# Patient Record
Sex: Female | Born: 1955 | Race: White | Hispanic: No | Marital: Married | Smoking: Never smoker
Health system: Southern US, Community
[De-identification: ages and names within clinical notes are randomized; demographics above are authoritative.]

## PROBLEM LIST (undated history)

## (undated) DIAGNOSIS — C539 Malignant neoplasm of cervix uteri, unspecified: Secondary | ICD-10-CM

## (undated) HISTORY — PX: NO PAST SURGERIES: SHX2092

---

## 2016-04-23 ENCOUNTER — Encounter (HOSPITAL_COMMUNITY): Payer: Self-pay | Admitting: Family Medicine

## 2016-04-23 ENCOUNTER — Observation Stay (HOSPITAL_COMMUNITY)
Admission: AD | Admit: 2016-04-23 | Discharge: 2016-04-24 | Disposition: A | Discharge status: Home / Self Care | Payer: BLUE CROSS/BLUE SHIELD | Source: Other Acute Inpatient Hospital | Attending: Internal Medicine | Admitting: Internal Medicine

## 2016-04-23 ENCOUNTER — Inpatient Hospital Stay (HOSPITAL_COMMUNITY): Payer: BLUE CROSS/BLUE SHIELD

## 2016-04-23 DIAGNOSIS — D696 Thrombocytopenia, unspecified: Secondary | ICD-10-CM

## 2016-04-23 DIAGNOSIS — I2699 Other pulmonary embolism without acute cor pulmonale: Secondary | ICD-10-CM | POA: Diagnosis not present

## 2016-04-23 DIAGNOSIS — R531 Weakness: Secondary | ICD-10-CM | POA: Diagnosis not present

## 2016-04-23 DIAGNOSIS — Z79899 Other long term (current) drug therapy: Secondary | ICD-10-CM | POA: Insufficient documentation

## 2016-04-23 DIAGNOSIS — E876 Hypokalemia: Secondary | ICD-10-CM | POA: Insufficient documentation

## 2016-04-23 DIAGNOSIS — D72819 Decreased white blood cell count, unspecified: Secondary | ICD-10-CM | POA: Diagnosis not present

## 2016-04-23 DIAGNOSIS — D61818 Other pancytopenia: Secondary | ICD-10-CM | POA: Diagnosis not present

## 2016-04-23 DIAGNOSIS — T451X5A Adverse effect of antineoplastic and immunosuppressive drugs, initial encounter: Secondary | ICD-10-CM | POA: Diagnosis not present

## 2016-04-23 DIAGNOSIS — D649 Anemia, unspecified: Secondary | ICD-10-CM

## 2016-04-23 DIAGNOSIS — C539 Malignant neoplasm of cervix uteri, unspecified: Secondary | ICD-10-CM | POA: Diagnosis not present

## 2016-04-23 DIAGNOSIS — M6281 Muscle weakness (generalized): Secondary | ICD-10-CM

## 2016-04-23 DIAGNOSIS — R112 Nausea with vomiting, unspecified: Secondary | ICD-10-CM | POA: Diagnosis not present

## 2016-04-23 DIAGNOSIS — J8410 Pulmonary fibrosis, unspecified: Secondary | ICD-10-CM | POA: Insufficient documentation

## 2016-04-23 DIAGNOSIS — J189 Pneumonia, unspecified organism: Secondary | ICD-10-CM

## 2016-04-23 DIAGNOSIS — R1110 Vomiting, unspecified: Secondary | ICD-10-CM

## 2016-04-23 HISTORY — DX: Malignant neoplasm of cervix uteri, unspecified: C53.9

## 2016-04-23 LAB — COMPREHENSIVE METABOLIC PANEL
ALT: 22 U/L (ref 14–54)
ANION GAP: 9 (ref 5–15)
AST: 21 U/L (ref 15–41)
Albumin: 2.5 g/dL — ABNORMAL LOW (ref 3.5–5.0)
Alkaline Phosphatase: 115 U/L (ref 38–126)
BILIRUBIN TOTAL: 1.3 mg/dL — AB (ref 0.3–1.2)
BUN: 23 mg/dL — ABNORMAL HIGH (ref 6–20)
CHLORIDE: 112 mmol/L — AB (ref 101–111)
CO2: 18 mmol/L — ABNORMAL LOW (ref 22–32)
Calcium: 8.3 mg/dL — ABNORMAL LOW (ref 8.9–10.3)
Creatinine, Ser: 0.96 mg/dL (ref 0.44–1.00)
Glucose, Bld: 96 mg/dL (ref 65–99)
POTASSIUM: 3.3 mmol/L — AB (ref 3.5–5.1)
Sodium: 139 mmol/L (ref 135–145)
TOTAL PROTEIN: 6.2 g/dL — AB (ref 6.5–8.1)

## 2016-04-23 LAB — CBC WITH DIFFERENTIAL/PLATELET
BASOS ABS: 0 10*3/uL (ref 0.0–0.1)
BASOS PCT: 0 %
EOS ABS: 0.1 10*3/uL (ref 0.0–0.7)
Eosinophils Relative: 2 %
HCT: 26.6 % — ABNORMAL LOW (ref 36.0–46.0)
Hemoglobin: 8.6 g/dL — ABNORMAL LOW (ref 12.0–15.0)
LYMPHS PCT: 43 %
Lymphs Abs: 1.2 10*3/uL (ref 0.7–4.0)
MCH: 26.8 pg (ref 26.0–34.0)
MCHC: 32.3 g/dL (ref 30.0–36.0)
MCV: 82.9 fL (ref 78.0–100.0)
MONO ABS: 0.1 10*3/uL (ref 0.1–1.0)
Monocytes Relative: 2 %
NEUTROS PCT: 53 %
Neutro Abs: 1.5 10*3/uL — ABNORMAL LOW (ref 1.7–7.7)
PLATELETS: 7 10*3/uL — AB (ref 150–400)
RBC: 3.21 MIL/uL — ABNORMAL LOW (ref 3.87–5.11)
RDW: 14.1 % (ref 11.5–15.5)
WBC: 2.9 10*3/uL — AB (ref 4.0–10.5)

## 2016-04-23 LAB — RETICULOCYTES

## 2016-04-23 LAB — APTT: APTT: 28 s (ref 24–36)

## 2016-04-23 LAB — MAGNESIUM: MAGNESIUM: 1.4 mg/dL — AB (ref 1.7–2.4)

## 2016-04-23 LAB — PREPARE RBC (CROSSMATCH)

## 2016-04-23 LAB — MRSA PCR SCREENING: MRSA by PCR: NEGATIVE

## 2016-04-23 LAB — PHOSPHORUS: PHOSPHORUS: 1.6 mg/dL — AB (ref 2.5–4.6)

## 2016-04-23 LAB — ABO/RH: ABO/RH(D): A POS

## 2016-04-23 LAB — SAVE SMEAR

## 2016-04-23 LAB — PROTIME-INR
INR: 1.21
Prothrombin Time: 15.4 seconds — ABNORMAL HIGH (ref 11.4–15.2)

## 2016-04-23 IMAGING — CR DG CHEST 2V
2 series · 2 of 2 positions shown · non-contrast
Comparison: None.

CLINICAL DATA: Pneumonia

EXAM:
CHEST  2 VIEW

[chest lat]
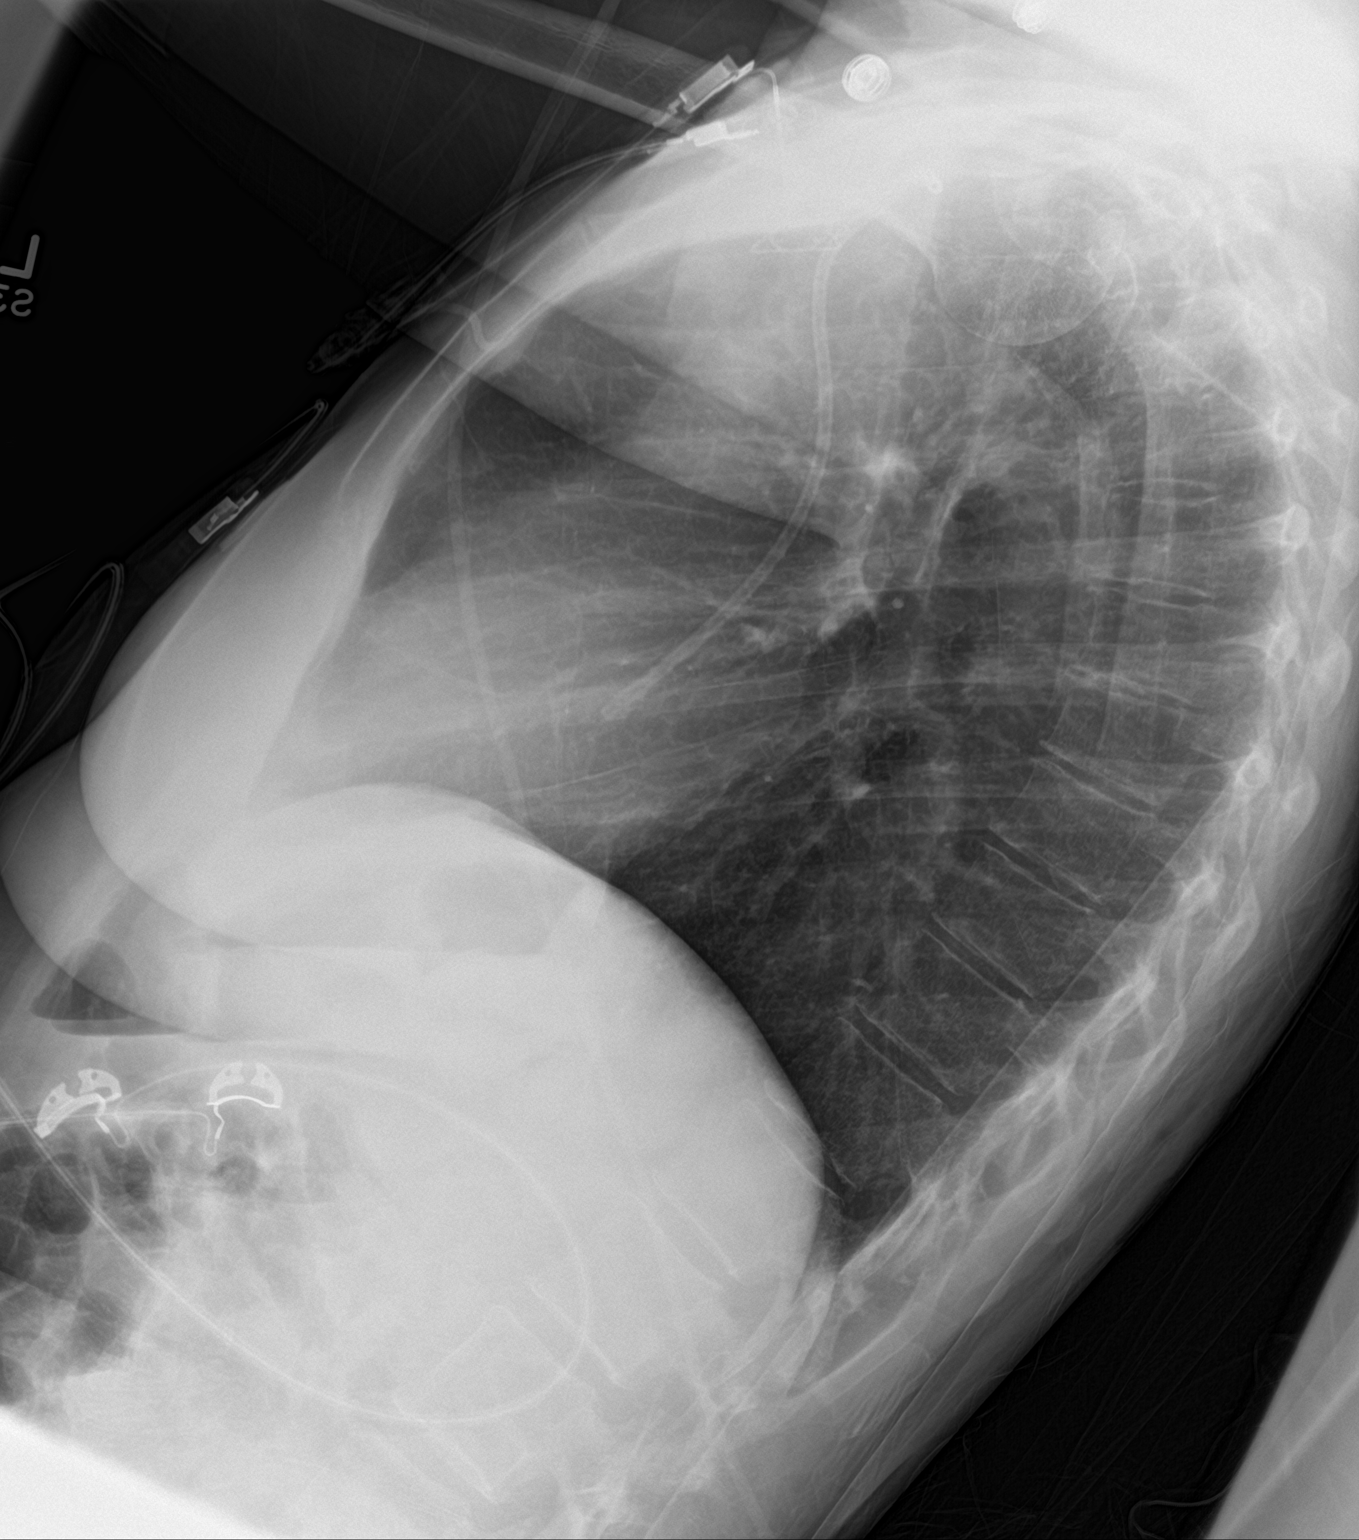

[chest ap]
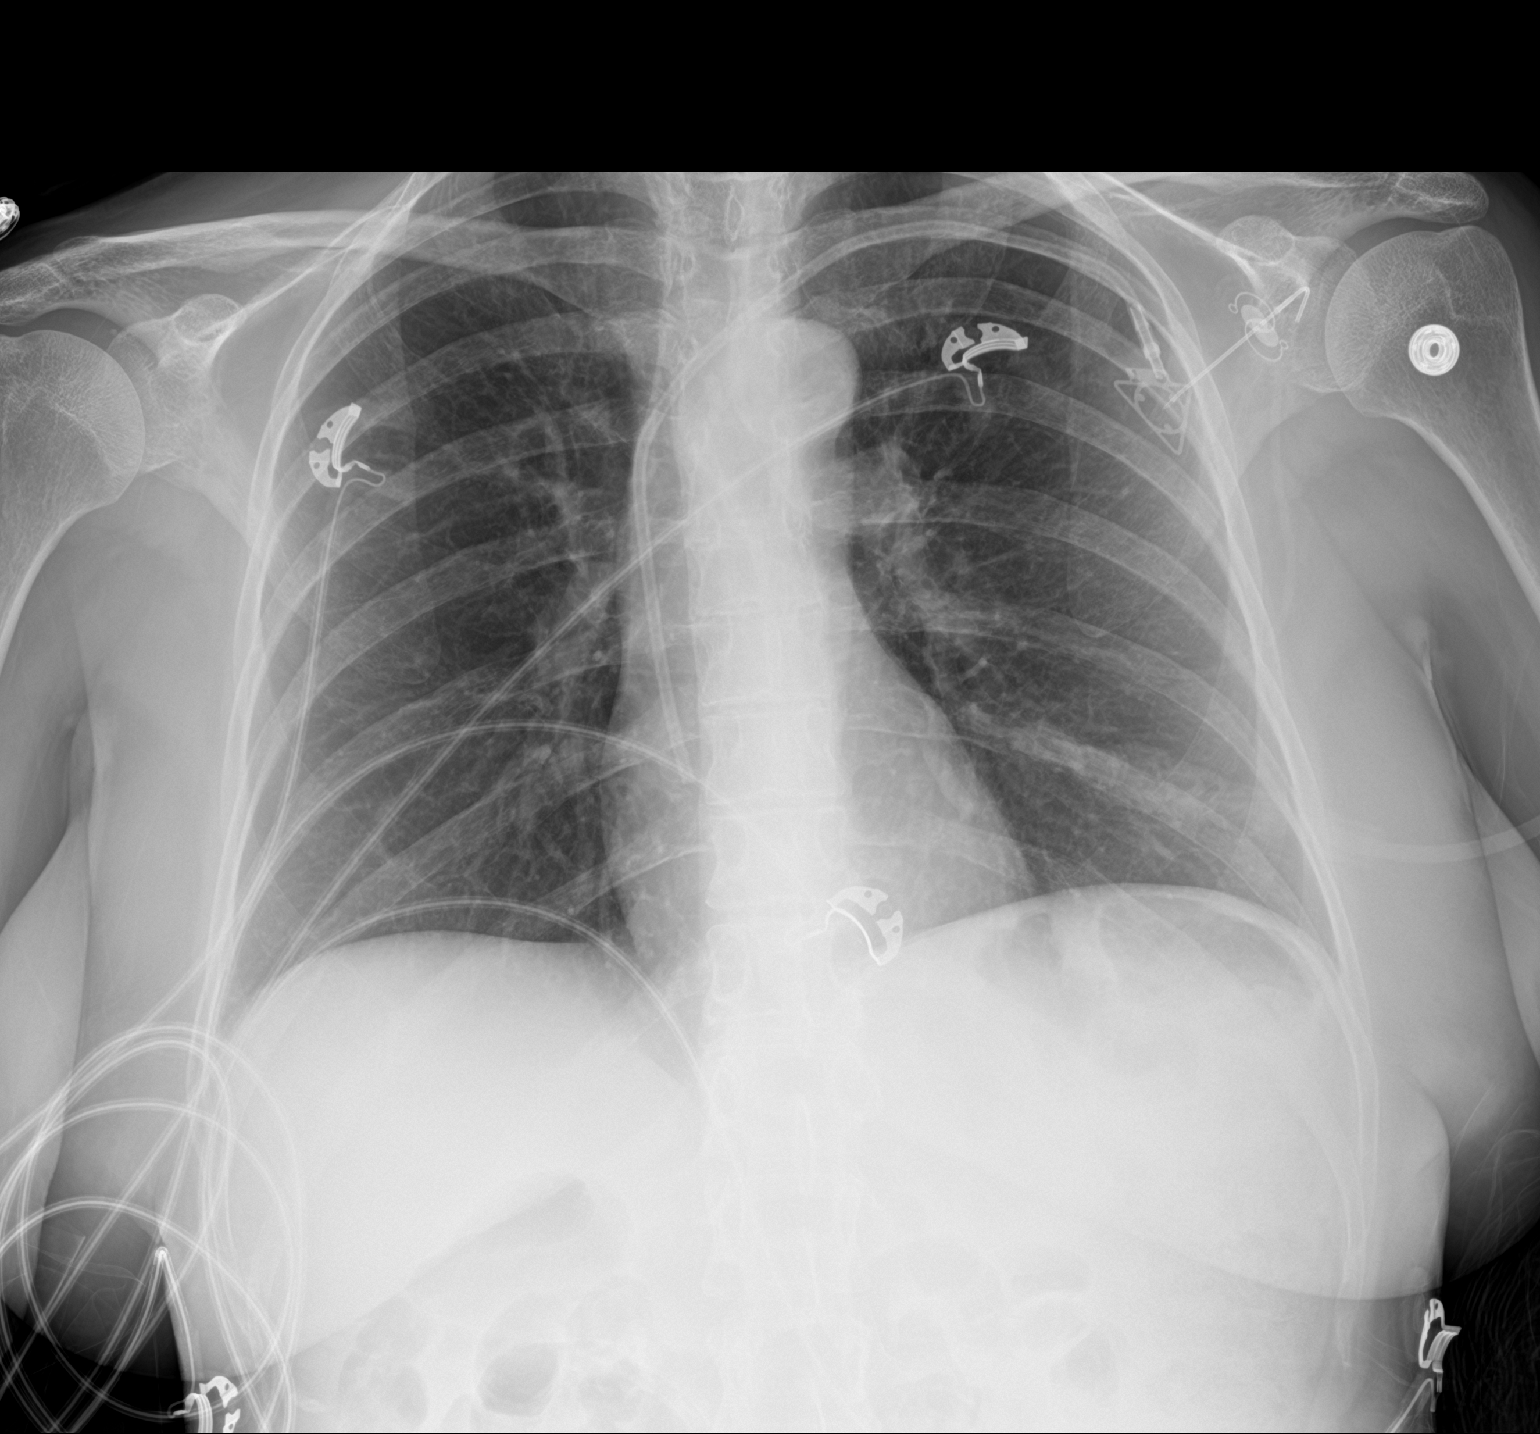

[2 of 2 positions shown; findings below may reference images not displayed]

FINDINGS: Porta catheter on the left with tip at the upper right atrium.
Normal heart size and mediastinal contours. No acute infiltrate or
edema. No effusion or pneumothorax. No acute osseous findings.
IMPRESSION: No evidence of active disease.

## 2016-04-23 MED ORDER — ACETAMINOPHEN 650 MG RE SUPP: 650.0000 mg | Freq: Four times a day (QID) | RECTAL | Status: DC | PRN

## 2016-04-23 MED ORDER — SODIUM CHLORIDE 0.9% FLUSH: 10.0000 mL | INTRAVENOUS | Status: DC | PRN

## 2016-04-23 MED ORDER — ONDANSETRON HCL 4 MG PO TABS: 4.0000 mg | ORAL_TABLET | Freq: Four times a day (QID) | ORAL | Status: DC | PRN

## 2016-04-23 MED ORDER — ACETAMINOPHEN 325 MG PO TABS: 650.0000 mg | ORAL_TABLET | Freq: Four times a day (QID) | ORAL | Status: DC | PRN

## 2016-04-23 MED ORDER — SODIUM CHLORIDE 0.9 % IV SOLN
INTRAVENOUS | Status: DC
  Administered 2016-04-23 – 2016-04-24 (×2): via INTRAVENOUS

## 2016-04-23 MED ORDER — ONDANSETRON HCL 4 MG/2ML IJ SOLN
4.0000 mg | Freq: Four times a day (QID) | INTRAMUSCULAR | Status: DC | PRN
  Administered 2016-04-23: 4 mg via INTRAVENOUS
  Filled 2016-04-23: qty 2

## 2016-04-23 MED ORDER — SODIUM CHLORIDE 0.9% FLUSH
3.0000 mL | Freq: Two times a day (BID) | INTRAVENOUS | Status: DC
  Administered 2016-04-23 – 2016-04-24 (×2): 3 mL via INTRAVENOUS

## 2016-04-23 MED ORDER — IOPAMIDOL (ISOVUE-370) INJECTION 76%
INTRAVENOUS | Status: AC
  Administered 2016-04-23: 80 mL
  Filled 2016-04-23: qty 100

## 2016-04-23 MED ORDER — SODIUM CHLORIDE 0.9 % IV SOLN: Freq: Once | INTRAVENOUS | Status: DC

## 2016-04-23 MED ORDER — PROMETHAZINE HCL 25 MG/ML IJ SOLN: 12.5000 mg | Freq: Four times a day (QID) | INTRAMUSCULAR | Status: DC | PRN

## 2016-04-23 MED ORDER — OXYCODONE HCL 5 MG PO TABS: 5.0000 mg | ORAL_TABLET | ORAL | Status: DC | PRN

## 2016-04-23 NOTE — Progress Notes (Signed)
Patient arrived via EMS from [facility] to 3s16. VSS with no complaints of pain. Patient on monitor and call bell within reach with safety discussed. Paged admission MD of patients arrival. Will continue to monitor.

## 2016-04-23 NOTE — Care Management Note (Signed)
Case Management Note  Patient Details  Name: Julie Harrell MRN: [id number] Date of Birth: 12-28-1955  Subjective/Objective:  Stage 4 cervical cancer, getting chemotherapy, plts is 13.  Patient lives with spouse, pta indep, she has pcp, has medication coverage and she has transportation at discharge, she goes to outpatient to receive chemotherapy in [place].  NCM will cont to follow for dc needs.                  Action/Plan:   Expected Discharge Date:                  Expected Discharge Plan:  Home/Self Care  In-House Referral:     Discharge planning Services  CM Consult  Post Acute Care Choice:    Choice offered to:     DME Arranged:    DME Agency:     HH Arranged:    HH Agency:     Status of Service:  In process, will continue to follow  If discussed at Long Length of Stay Meetings, dates discussed:    Additional Comments:  [name], RN 04/23/2016, 4:39 PM

## 2016-04-23 NOTE — Progress Notes (Signed)
Called by radiologist after CTA which notes very small 3 mm pulmonary embolus. Difficult to say whether this is new for him original PE back in July. Anticoagulation will need to be discussed further with patient after thrombus cytopenia improves. Will obtain lower extremity venous duplex to look for ongoing lower extremity DVT. Of note early in the day CBC returned with plate count less than 7. Platelet transfusion ordered and follow-up labs pending.  Linna Darner, MD [facility] 04/23/2016, 5:27 PM

## 2016-04-23 NOTE — Progress Notes (Signed)
Additional information obtained by nursing staff concerning patient's past medical history and current medications. Patient states that approximately 6 weeks ago she was diagnosed with a massive saddle pulmonary embolism and was started on Plavix by her primary care physician. Patient states that she was never offered a typical agent such as Xarelto, Coumadin, I'll request. Patient states that she's never had lower extremity swelling. Patient states she had a follow-up scan which showed that the embolisms breaking up. Per review of patient's chart through care everywhere her PE was documented in office notes as early as 02/18/2016 but there is no imaging or hospital encounter to confirm a PE. At this time patient's renal function is normal and we will proceed with CTA chest to evaluate for PE. Of note Plavix is not an option for treatment of PE unless all other agents are contraindicated. There is no discussion of contraindication of anticoagulation from her oncology notes as read in care everywhere. At this point in time patient's platelets are at 13 so no anticoagulation is needed. Further decisions regarding anticoagulation will depend on how her platelets rebound and on diagnostic CTA. I'll consider lower extremity venous duplex if CTA is negative.  Linna Darner, MD [facility] 04/23/2016, 1:34 PM

## 2016-04-23 NOTE — H&P (Signed)
History and Physical    Rue Krabbenhoft [id number] DOB: Mar 03, 1956 DOA: 04/23/2016  PCP: Jacqualine Code, DO Patient coming from: [facility]  Chief Complaint: n/v weakness  HPI: Julie Harrell is a 60 y.o. female with medical history significant of recurrent cervical cancer. Patient receives treatment for this at [facility]. Patient with left chest wall Port-A-Cath in place. Patient started her chemotherapy 2 weeks ago. Patient had undergone her second chemotherapy on 04/17/2016. Since initiating her chemotherapy she has had fairly constant nausea and vomiting. This is significantly relieved, though intermittently, with Phenergan and Zofran together. Patient became progressively more weak over the course of the last 6 days to the point where she is unable to function. Patient went to [facility] for evaluation. [facility] and [facility] both were without any beds to evaluate and treat the patient so transfer was requested to bring her to [facility]. All transfer records were reviewed. Objective findings outlined below. Patient was given Zofran, 1 L normal saline bolus, and cefepime prior to transfer. Patient denies any chest pain, shortness of breath, palpitations, cough, neck stiffness, headache, abdominal pain, dysuria, frequency, back pain, rash.   Review of Systems: As per HPI otherwise 10 point review of systems negative.   Ambulatory Status:no restrictions  Past Medical History:  Diagnosis Date  . Recurrent cervical cancer of unknown cell type [facility])     Past Surgical History:  Procedure Laterality Date  . NO PAST SURGERIES      Social History   Social History  . Marital status: Married    Spouse name: N/A  . Number of children: N/A  . Years of education: N/A   Occupational History  . Not on file.   Social History Main Topics  . Smoking status: Never Smoker  . Smokeless tobacco: Not on file  . Alcohol use No  .  Drug use: No  . Sexual activity: Not on file   Other Topics Concern  . Not on file   Social History Narrative  . No narrative on file    Allergies not on file  Family History  Problem Relation Age of Onset  . Cancer Neg Hx   . Diabetes Neg Hx   . Hypertension Neg Hx   . Hyperlipidemia Neg Hx     Prior to Admission medications   Not on File    Physical Exam: Vitals:   04/23/16 0800  BP: 113/75  Pulse: 98  Resp: 19  Temp: 98.5 F (36.9 C)  TempSrc: Oral  SpO2: 99%  Weight: 55.1 kg (121 lb 7.6 oz)  Height: 5\' 5"  (1.651 m)     General:  Appears calm and comfortable Eyes:  PERRL, EOMI, normal lids, iris ENT:  grossly normal hearing, lips & tongue, mmm Neck:  no LAD, masses or thyromegaly Cardiovascular:  RRR, no m/r/g. No LE edema.  Respiratory:  CTA bilaterally, no w/r/r. Normal respiratory effort. Abdomen:  soft, ntnd, NABS Skin:  no rash or induration seen on limited exam Musculoskeletal:  grossly normal tone BUE/BLE, good ROM, no bony abnormality. L upper chest wall w/ portocath in place.  Psychiatric:  grossly normal mood and affect, speech fluent and appropriate, AOx3 Neurologic:  CN 2-12 grossly intact, moves all extremities in coordinated fashion, sensation intact  Labs on Admission: I have personally reviewed following labs and imaging studies  CBC: No results for input(s): WBC, NEUTROABS, HGB, HCT, MCV, PLT in the last 168 hours. Basic Metabolic Panel: No results for input(s): NA, K,  CL, CO2, GLUCOSE, BUN, CREATININE, CALCIUM, MG, PHOS in the last 168 hours. GFR: CrCl cannot be calculated (No order found.). Liver Function Tests: No results for input(s): AST, ALT, ALKPHOS, BILITOT, PROT, ALBUMIN in the last 168 hours. No results for input(s): LIPASE, AMYLASE in the last 168 hours. No results for input(s): AMMONIA in the last 168 hours. Coagulation Profile: No results for input(s): INR, PROTIME in the last 168 hours. Cardiac Enzymes: No results for  input(s): CKTOTAL, CKMB, CKMBINDEX, TROPONINI in the last 168 hours. BNP (last 3 results) No results for input(s): PROBNP in the last 8760 hours. HbA1C: No results for input(s): HGBA1C in the last 72 hours. CBG: No results for input(s): GLUCAP in the last 168 hours. Lipid Profile: No results for input(s): CHOL, HDL, LDLCALC, TRIG, CHOLHDL, LDLDIRECT in the last 72 hours. Thyroid Function Tests: No results for input(s): TSH, T4TOTAL, FREET4, T3FREE, THYROIDAB in the last 72 hours. Anemia Panel: No results for input(s): VITAMINB12, FOLATE, FERRITIN, TIBC, IRON, RETICCTPCT in the last 72 hours. Urine analysis: No results found for: COLORURINE, APPEARANCEUR, LABSPEC, PHURINE, GLUCOSEU, HGBUR, BILIRUBINUR, KETONESUR, PROTEINUR, UROBILINOGEN, NITRITE, LEUKOCYTESUR  Creatinine Clearance: CrCl cannot be calculated (No order found.).  Sepsis Labs: @LABRCNTIP (procalcitonin:4,lacticidven:4) ) Recent Results (from the past 240 hour(s))  MRSA PCR Screening     Status: None   Collection Time: 04/23/16  8:07 AM  Result Value Ref Range Status   MRSA by PCR NEGATIVE NEGATIVE Final    Comment:        The GeneXpert MRSA Assay (FDA approved for NASAL specimens only), is one component of a comprehensive MRSA colonization surveillance program. It is not intended to diagnose MRSA infection nor to guide or monitor treatment for MRSA infections.      Radiological Exams on Admission: No results found.    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 Review of records from March the hospital show WBC 2.3, hemoglobin 7.6, hematocrit 23, platelets 13. No previous studies to compare. She was in her second week of chemotherapy. No h/o GI bleed or vaginal bleeding.  - 1 unit PRBC - PLT transfer if less than 10,000 or starts to  bleed - CBC now then Q12 - FOBT -   N/V Weakness: Secondary to side effects from chemotherapy. Controlled at this time. Patient states that Phenergan and Zofran to work very well. - EKG - Phenergan/Zofran - IVF - PT  Recurrent Cervical Cancer: Currently undergoing chemotherapy treatment at [facility]. Patient has received 2 rounds of chemotherapy with her last treatment on 04/17/2016. Patient due for next chemotherapy on 04/24/2016  - A defer further chemotherapy given side effects of previous administration - Follow-up outpatient oncology and [facility]  Infectious: No signs of neutropenic infection/fever. UA performed in [place] was unremarkable. Unable to see plain film properly on the disc taken down from [place]. Patient was started on cefepime prior to transfer. WBC 2.3, lactic acid 1.6, AF VSS. - Hold further antibiotics - BCX, UCX - Repeat chest x-ray    DVT prophylaxis: SCD  Code Status: full  Family Communication: none  Disposition Plan: pendingi mprovement  Consults called: none  Admission status: inpt    MERRELL, DAVID J MD [facility]  If 7PM-7AM, please contact night-coverage [email or web address] Password [email or web address]  04/23/2016, 11:17 AM

## 2016-04-24 ENCOUNTER — Inpatient Hospital Stay (HOSPITAL_COMMUNITY): Payer: BLUE CROSS/BLUE SHIELD

## 2016-04-24 DIAGNOSIS — R1110 Vomiting, unspecified: Secondary | ICD-10-CM

## 2016-04-24 DIAGNOSIS — I2699 Other pulmonary embolism without acute cor pulmonale: Secondary | ICD-10-CM | POA: Diagnosis not present

## 2016-04-24 DIAGNOSIS — D72819 Decreased white blood cell count, unspecified: Secondary | ICD-10-CM

## 2016-04-24 DIAGNOSIS — D696 Thrombocytopenia, unspecified: Secondary | ICD-10-CM | POA: Diagnosis not present

## 2016-04-24 DIAGNOSIS — D649 Anemia, unspecified: Secondary | ICD-10-CM | POA: Diagnosis not present

## 2016-04-24 DIAGNOSIS — C539 Malignant neoplasm of cervix uteri, unspecified: Secondary | ICD-10-CM

## 2016-04-24 DIAGNOSIS — D61818 Other pancytopenia: Secondary | ICD-10-CM | POA: Diagnosis not present

## 2016-04-24 LAB — COMPREHENSIVE METABOLIC PANEL
ALBUMIN: 2.4 g/dL — AB (ref 3.5–5.0)
ALK PHOS: 107 U/L (ref 38–126)
ALT: 20 U/L (ref 14–54)
ANION GAP: 6 (ref 5–15)
AST: 22 U/L (ref 15–41)
BILIRUBIN TOTAL: 0.7 mg/dL (ref 0.3–1.2)
BUN: 17 mg/dL (ref 6–20)
CALCIUM: 7.9 mg/dL — AB (ref 8.9–10.3)
CO2: 20 mmol/L — AB (ref 22–32)
CREATININE: 0.94 mg/dL (ref 0.44–1.00)
Chloride: 114 mmol/L — ABNORMAL HIGH (ref 101–111)
GFR calc non Af Amer: >60 mL/min (ref >60)
GLUCOSE: 96 mg/dL (ref 65–99)
Potassium: 2.9 mmol/L — ABNORMAL LOW (ref 3.5–5.1)
Sodium: 140 mmol/L (ref 135–145)
Total Protein: 5.6 g/dL — ABNORMAL LOW (ref 6.5–8.1)

## 2016-04-24 LAB — CBC
HEMATOCRIT: 28.2 % — AB (ref 36.0–46.0)
HEMOGLOBIN: 9.3 g/dL — AB (ref 12.0–15.0)
MCH: 28.4 pg (ref 26.0–34.0)
MCHC: 33 g/dL (ref 30.0–36.0)
MCV: 86.2 fL (ref 78.0–100.0)
Platelets: 123 10*3/uL — ABNORMAL LOW (ref 150–400)
RBC: 3.27 MIL/uL — ABNORMAL LOW (ref 3.87–5.11)
RDW: 14.5 % (ref 11.5–15.5)
WBC: 3.8 10*3/uL — ABNORMAL LOW (ref 4.0–10.5)

## 2016-04-24 LAB — PREPARE PLATELET PHERESIS
UNIT DIVISION: 0
Unit division: 0

## 2016-04-24 LAB — TYPE AND SCREEN
ABO/RH(D): A POS
Antibody Screen: NEGATIVE
Unit division: 0

## 2016-04-24 LAB — URINE CULTURE: CULTURE: NO GROWTH

## 2016-04-24 MED ORDER — MAGNESIUM OXIDE 400 (241.3 MG) MG PO TABS: 400.0000 mg | ORAL_TABLET | Freq: Two times a day (BID) | ORAL | 0 refills | Status: AC

## 2016-04-24 MED ORDER — POTASSIUM CHLORIDE CRYS ER 20 MEQ PO TBCR
40.0000 meq | EXTENDED_RELEASE_TABLET | Freq: Two times a day (BID) | ORAL | Status: DC
  Administered 2016-04-24: 40 meq via ORAL
  Filled 2016-04-24: qty 2

## 2016-04-24 MED ORDER — LUBIPROSTONE 24 MCG PO CAPS
24.0000 ug | ORAL_CAPSULE | Freq: Every evening | ORAL | Status: DC
  Administered 2016-04-24: 24 ug via ORAL
  Filled 2016-04-24: qty 1

## 2016-04-24 MED ORDER — GABAPENTIN 100 MG PO CAPS: 100.0000 mg | ORAL_CAPSULE | Freq: Three times a day (TID) | ORAL | Status: DC

## 2016-04-24 MED ORDER — HEPARIN SOD (PORK) LOCK FLUSH 100 UNIT/ML IV SOLN
500.0000 [IU] | INTRAVENOUS | Status: AC | PRN
  Administered 2016-04-24: 500 [IU]

## 2016-04-24 MED ORDER — MAGNESIUM OXIDE 400 (241.3 MG) MG PO TABS: 400.0000 mg | ORAL_TABLET | Freq: Two times a day (BID) | ORAL | Status: DC

## 2016-04-24 MED ORDER — PROMETHAZINE HCL 25 MG PO TABS: 25.0000 mg | ORAL_TABLET | Freq: Four times a day (QID) | ORAL | Status: DC | PRN

## 2016-04-24 MED ORDER — RIVAROXABAN 20 MG PO TABS
20.0000 mg | ORAL_TABLET | Freq: Every day | ORAL | Status: DC
  Administered 2016-04-24: 20 mg via ORAL
  Filled 2016-04-24: qty 1

## 2016-04-24 MED ORDER — ONDANSETRON HCL 4 MG PO TABS: 4.0000 mg | ORAL_TABLET | Freq: Three times a day (TID) | ORAL | Status: DC | PRN

## 2016-04-24 MED ORDER — ATORVASTATIN CALCIUM 10 MG PO TABS
10.0000 mg | ORAL_TABLET | Freq: Every day | ORAL | Status: DC
  Administered 2016-04-24: 10 mg via ORAL
  Filled 2016-04-24: qty 1

## 2016-04-24 MED ORDER — FLUOXETINE HCL 20 MG PO CAPS
40.0000 mg | ORAL_CAPSULE | Freq: Every day | ORAL | Status: DC
  Administered 2016-04-24: 40 mg via ORAL
  Filled 2016-04-24: qty 2

## 2016-04-24 MED ORDER — POTASSIUM CHLORIDE CRYS ER 20 MEQ PO TBCR: 40.0000 meq | EXTENDED_RELEASE_TABLET | Freq: Two times a day (BID) | ORAL | 0 refills | Status: AC

## 2016-04-24 MED ORDER — NORTRIPTYLINE HCL 25 MG PO CAPS: 25.0000 mg | ORAL_CAPSULE | Freq: Every day | ORAL | Status: DC

## 2016-04-24 NOTE — Evaluation (Signed)
Physical Therapy Evaluation Patient Details Name: Julie Harrell MRN: [id number] DOB: [birth year] Today's Date: 04/24/2016   History of Present Illness  Julie Harrell is a 60 y.o. female with medical history significant of recurrent cervical cancer.  Admitted with generalized weakness, N&V and Thrombocytopenia/Anemia: Likely chemotherapy-induced. Review of records from March the hospital show WBC 2.3, hemoglobin 7.6, hematocrit 23, platelets 13.   Clinical Impression  Patient presents close to functional baseline.  No further need for skilled PT at this time.  Did educate in energy conservation techniques for home and in walking program if able to assist with return to work when cleared by MD.  Also educated in fall prevention due to neuropathy.  No further PT needs at this time.     Follow Up Recommendations No PT follow up    Equipment Recommendations  None recommended by PT    Recommendations for Other Services       Precautions / Restrictions Precautions Precautions: None      Mobility  Bed Mobility               General bed mobility comments: up in chair  Transfers Overall transfer level: Modified independent Equipment used: None                Ambulation/Gait Ambulation/Gait assistance: Independent Ambulation Distance (Feet): 400 Feet Assistive device: None Gait Pattern/deviations: Step-through pattern;WFL(Within Functional Limits)     General Gait Details: see DGI  Stairs            Wheelchair Mobility    Modified Rankin (Stroke Patients Only)       Balance                                 Standardized Balance Assessment Standardized Balance Assessment : Dynamic Gait Index   Dynamic Gait Index Level Surface: Normal Change in Gait Speed: Normal Gait with Horizontal Head Turns: Normal Gait with Vertical Head Turns: Mild Impairment Gait and Pivot Turn: Normal Step Over Obstacle: Normal Step Around Obstacles: Normal Steps:   (NT)       Pertinent Vitals/Pain Pain Assessment: No/denies pain    Home Living Family/patient expects to be discharged to:: Private residence Living Arrangements: Spouse/significant other Available Help at Discharge: Family;Available 24 hours/day (spouse works, but mother also able to assist) Type of Home: House Home Access: Level entry     Home Layout: One level Home Equipment: None      Prior Function Level of Independence: Independent         Comments: 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     [place]        Extremity/Trunk Assessment   Upper Extremity Assessment: Overall WFL for tasks assessed           Lower Extremity Assessment: RLE deficits/detail;LLE deficits/detail (numbness in hands) RLE Deficits / Details: AROM and strength WFL except hip flexion 4-/5 LLE Deficits / Details: AROM and strength WFL except hip flexion 4-/5     Communication   Communication: No difficulties  Cognition Arousal/Alertness: Awake/alert Behavior During Therapy: WFL for tasks assessed/performed Overall Cognitive Status: Within Functional Limits for tasks assessed                      General Comments General comments (skin integrity, edema, etc.): did not practice steps due to no steps at home    Exercises  Assessment/Plan    PT Assessment Patent does not need any further PT services  PT Problem List            PT Treatment Interventions      PT Goals (Current goals can be found in the Care Plan section)  Acute Rehab PT Goals Patient Stated Goal: to go home today PT Goal Formulation: All assessment and education complete, DC therapy    Frequency     Barriers to discharge        Co-evaluation               End of Session Equipment Utilized During Treatment: Gait belt Activity Tolerance: Patient tolerated treatment well Patient left: in chair;with call bell/phone within reach           Time:  1336-1400 PT Time Calculation (min) (ACUTE ONLY): 24 min   Charges:   PT Evaluation $PT Eval Moderate Complexity: 1 Procedure PT Treatments $Gait Training: 8-22 mins   PT G CodesReginia Naas 05/19/2016, 2:44 PM  Magda Kiel, [place] 05-19-2016

## 2016-04-24 NOTE — Progress Notes (Signed)
Explained and discussed discharge instructions to pt and family. Follow up appt given to pt and prescriptions given to pt. No complaints voiced at this time. Pt going home with belongings via w/c with husband.

## 2016-04-24 NOTE — Discharge Instructions (Signed)
On Monday, April 27, 2016 you need to be seen in a medical clinic to have the following lab studies evaluated: -check of Hemoglobin (blood count), Platelets, and WBC (on Xarelto w/ chemo induced thrombocytopenia) -check of potassium and magnesium levels    Thrombocytopenia Thrombocytopenia is a condition in which there is an abnormally small number of platelets in your blood. Platelets are also called thrombocytes. Platelets are needed for blood clotting. CAUSES Thrombocytopenia is caused by:   Decreased production of platelets. This can be caused by:  Aplastic anemia in which your bone marrow quits making blood cells.  Cancer in the bone marrow.  Use of certain medicines, including chemotherapy.  Infection in the bone marrow.  Heavy alcohol consumption.  Increased destruction of platelets. This can be caused by:  Certain immune diseases.  Use of certain drugs.  Certain blood clotting disorders.  Certain inherited disorders.  Certain bleeding disorders.  Pregnancy.  Having an enlarged spleen (hypersplenism). In hypersplenism, the spleen gathers up platelets from circulation. This means the platelets are not available to help with blood clotting. The spleen can enlarge due to cirrhosis or other conditions. SYMPTOMS  The symptoms of thrombocytopenia are side effects of poor blood clotting. Some of these are:  Abnormal bleeding.  Nosebleeds.  Heavy menstrual periods.  Blood in the urine or stools.  Purpura. This is a purplish discoloration in the skin produced by small bleeding vessels near the surface of the skin.  Bruising.  A rash that may be petechial. This looks like pinpoint, purplish-red spots on the skin and mucous membranes. It is caused by bleeding from small blood vessels (capillaries). DIAGNOSIS  Your caregiver will make this diagnosis based on your exam and blood tests. Sometimes, a bone marrow study is done to look for the original cells  (megakaryocytes) that make platelets. TREATMENT  Treatment depends on the cause of the condition.  Medicines may be given to help protect your platelets from being destroyed.  In some cases, a replacement (transfusion) of platelets may be required to stop or prevent bleeding.  Sometimes, the spleen must be surgically removed. HOME CARE INSTRUCTIONS   Check the skin and linings inside your mouth for bruising or bleeding as directed by your caregiver.  Check your sputum, urine, and stool for blood as directed by your caregiver.  Do not return to any activities that could cause bumps or bruises until your caregiver says it is okay.  Take extra care not to cut yourself when shaving or when using scissors, needles, knives, and other tools.  Take extra care not to burn yourself when ironing or cooking.  Ask your caregiver if it is okay for you to drink alcohol.  Only take over-the-counter or prescription medicines as directed by your caregiver.  Notify all your caregivers, including dentists and eye doctors, about your condition. SEEK IMMEDIATE MEDICAL CARE IF:   You develop active bleeding from anywhere in your body.  You develop unexplained bruising or bleeding.  You have blood in your sputum, urine, or stool. MAKE SURE YOU:  Understand these instructions.  Will watch your condition.  Will get help right away if you are not doing well or get worse.   This information is not intended to replace advice given to you by your health care provider. Make sure you discuss any questions you have with your health care provider.   Document Released: 07/27/2005 Document Revised: 10/19/2011 Document Reviewed: 01/28/2015 Elsevier Interactive Patient Education 2016 Elsevier Inc.  Anemia, Nonspecific Anemia is  a condition in which the concentration of red blood cells or hemoglobin in the blood is below normal. Hemoglobin is a substance in red blood cells that carries oxygen to the tissues  of the body. Anemia results in not enough oxygen reaching these tissues.  CAUSES  Common causes of anemia include:   Excessive bleeding. Bleeding may be internal or external. This includes excessive bleeding from periods (in women) or from the intestine.   Poor nutrition.   Chronic kidney, thyroid, and liver disease.  Bone marrow disorders that decrease red blood cell production.  Cancer and treatments for cancer.  HIV, AIDS, and their treatments.  Spleen problems that increase red blood cell destruction.  Blood disorders.  Excess destruction of red blood cells due to infection, medicines, and autoimmune disorders. SIGNS AND SYMPTOMS   Minor weakness.   Dizziness.   Headache.  Palpitations.   Shortness of breath, especially with exercise.   Paleness.  Cold sensitivity.  Indigestion.  Nausea.  Difficulty sleeping.  Difficulty concentrating. Symptoms may occur suddenly or they may develop slowly.  DIAGNOSIS  Additional blood tests are often needed. These help your health care provider determine the best treatment. Your health care provider will check your stool for blood and look for other causes of blood loss.  TREATMENT  Treatment varies depending on the cause of the anemia. Treatment can include:   Supplements of iron, vitamin 123456, or folic acid.   Hormone medicines.   A blood transfusion. This may be needed if blood loss is severe.   Hospitalization. This may be needed if there is significant continual blood loss.   Dietary changes.  Spleen removal. HOME CARE INSTRUCTIONS Keep all follow-up appointments. It often takes many weeks to correct anemia, and having your health care provider check on your condition and your response to treatment is very important. SEEK IMMEDIATE MEDICAL CARE IF:   You develop extreme weakness, shortness of breath, or chest pain.   You become dizzy or have trouble concentrating.  You develop heavy vaginal  bleeding.   You develop a rash.   You have bloody or black, tarry stools.   You faint.   You vomit up blood.   You vomit repeatedly.   You have abdominal pain.  You have a fever or persistent symptoms for more than 2-3 days.   You have a fever and your symptoms suddenly get worse.   You are dehydrated.  MAKE SURE YOU:  Understand these instructions.  Will watch your condition.  Will get help right away if you are not doing well or get worse.   This information is not intended to replace advice given to you by your health care provider. Make sure you discuss any questions you have with your health care provider.   Document Released: 09/03/2004 Document Revised: 03/29/2013 Document Reviewed: 01/20/2013 Elsevier Interactive Patient Education [employer].

## 2016-04-24 NOTE — Discharge Summary (Signed)
DISCHARGE SUMMARY  Julie Harrell  MR#: [id number]  DOB:June 02, 1956  Date of Admission: 04/23/2016 Date of Discharge: 04/24/2016  Attending Physician:Rabecca Birge T  Patient's SO:[id number] [name], DO  Consults:  none  Disposition: D/C home   Follow-up Appts: Follow-up Information    FAVERO,Julie [name], DO Follow up on 04/27/2016.   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  She started chemotherapy 2 weeks prior to this admit, and underwent her second tx on 04/17/2016. Since initiating chemo she has had constant nausea and vomiting, and became progressively weaker over the course of 6 days to the point where she was unable to function. Patient went to [facility] for evaluation, but [facility] and [facility] both were without beds.  Hospital Course:  Pancytopenia Suspect chemotherapy-induced - WBC 2.3 hemoglobin 7.6 and platelet 13 at baseline in March - transfused PRBC and Plt here at [facility] - all counts much improved - pt advised on serious need to follow this serially, and absolute need to have counts recheck by Monday   Single punctate pulmonary embolus in a left lower lobe segmental branch Records from [facility] indicate the patient was diagnosed with a saddle PE  in June 2017 and treated with Xarelto - anticoag high risk given plt count of 7 - f/u TTE during admit there 8/30 noted thrombus in-transit on TTE - discussed high risk of going w/o anticoagulation - pt to cont Xarelto - pt counseled on need to monitor for s/sx of blood loss and need to have counts rechecked in 3 days   Hypokalemia Due to poor intake - now able to eat w/o difficulty - dose w/ 49meq load prior to d/c, then daily for next 2 days  Hypomagnesemia Begin oral replacement for planned 3 days   Constant nausea with vomiting and generalized weakness Last chemotherapy treatment 04/17/16 - to follow w/ her Onc team in [facility]   Recurrent Stage 4 adenocarcinoma of cervix PET on 02/22/16 noted mediastinal, hilar, R supraclavicular and upper retroperitoneal lymphadenopathy, increasing L adrenal mass, indeterminate LLL opacity  Non-caseating granulomatous disease of lungs  S/p mediastinoscopy w/ bx 08/09/15    Medication List    TAKE these medications   AMITIZA 24 MCG capsule Generic drug:  lubiprostone Take 24 mg by mouth every evening.   atorvastatin 10 MG tablet Commonly known as:  LIPITOR Take 10 mg by mouth daily.   dexamethasone 4 MG tablet Commonly known as:  DECADRON Take 8 tablets by mouth daily. For 2 days after chemo   FLUoxetine 40 MG capsule Commonly known as:  PROZAC Take 40 mg by mouth daily.   gabapentin 100 MG capsule Commonly known as:  NEURONTIN Take 100 mg by mouth 3 (three) times daily.   magnesium oxide 400 (241.3 Mg) MG tablet Commonly known as:  MAG-OX Take 1 tablet (400 mg total) by mouth 2 (two) times daily.  nortriptyline 25 MG capsule Commonly known as:  PAMELOR Take 25 mg by mouth daily.   ondansetron 4 MG tablet Commonly known as:  ZOFRAN Take 4 mg by mouth every 8 (eight) hours as needed for nausea or vomiting.   potassium chloride SA 20 MEQ tablet Commonly known as:  K-DUR,KLOR-CON Take 2 tablets (40 mEq total) by mouth 2 (two)  times daily.   promethazine 25 MG tablet Commonly known as:  PHENERGAN Take 25 mg by mouth every 6 (six) hours as needed for nausea or vomiting.   XARELTO 20 MG Tabs tablet Generic drug:  rivaroxaban Take 20 mg by mouth every evening.       Day of Discharge BP 120/80 (BP Location: Left Arm)   Pulse 88   Temp 98.3 F (36.8 C) (Oral)   Resp 20   Ht 5\' 5"  (1.651 m)   Wt 55.1 kg (121 lb 7.6 oz)   SpO2 98%   BMI 20.21 kg/m   Physical Exam: General: No acute respiratory distress Lungs: Clear to auscultation bilaterally without wheezes or crackles Cardiovascular: Regular rate and rhythm without murmur gallop or rub normal S1 and S2 Abdomen: Nontender, nondistended, soft, bowel sounds positive, no rebound, no ascites, no appreciable mass Extremities: No significant cyanosis, clubbing, or edema bilateral lower extremities  Basic Metabolic Panel:  Recent Labs Lab 04/23/16 1151 04/24/16 0525  NA 139 140  K 3.3* 2.9*  CL 112* 114*  CO2 18* 20*  GLUCOSE 96 96  BUN 23* 17  CREATININE 0.96 0.94  CALCIUM 8.3* 7.9*  MG 1.4*  --   PHOS 1.6*  --     Liver Function Tests:  Recent Labs Lab 04/23/16 1151 04/24/16 0525  AST 21 22  ALT 22 20  ALKPHOS 115 107  BILITOT 1.3* 0.7  PROT 6.2* 5.6*  ALBUMIN 2.5* 2.4*   Coags:  Recent Labs Lab 04/23/16 1151  INR 1.21   CBC:  Recent Labs Lab 04/23/16 1151 04/24/16 0525  WBC 2.9* 3.8*  NEUTROABS 1.5*  --   HGB 8.6* 9.3*  HCT 26.6* 28.2*  MCV 82.9 86.2  PLT 7* 123*    Recent Results (from the past 240 hour(s))  MRSA PCR Screening     Status: None   Collection Time: 04/23/16  8:07 AM  Result Value Ref Range Status   MRSA by PCR NEGATIVE NEGATIVE Final    Comment:        The GeneXpert MRSA Assay (FDA approved for NASAL specimens only), is one component of a comprehensive MRSA colonization surveillance program. It is not intended to diagnose MRSA infection nor to guide or monitor treatment for MRSA  infections.   Urine culture     Status: None   Collection Time: 04/23/16  4:18 PM  Result Value Ref Range Status   Specimen Description URINE, CLEAN CATCH  Final   Special Requests NONE  Final   Culture NO GROWTH  Final   Report Status 04/24/2016 FINAL  Final      Time spent in discharge (includes decision making & examination of pt): <30 minutes  04/24/2016, 3:39 PM   Cherene Altes, MD [facility] Office  [phone] Pager [phone]  On-Call/Text Page:      [email or web address]      password [facility]

## 2016-04-24 NOTE — Care Management Note (Signed)
Case Management Note  Patient Details  Name: Julie Harrell MRN: [id number] Date of Birth: 09-29-1955  Subjective/Objective:     Stage 4 cervical cancer, getting chemotherapy, plts is 13.  Patient lives with spouse, pta indep, she has pcp, has medication coverage and she has transportation at discharge, she goes to outpatient to receive chemotherapy in [place], per pt eval no pt f/u needed.  Patient is for dc today.               Action/Plan:   Expected Discharge Date:                  Expected Discharge Plan:  Home/Self Care  In-House Referral:     Discharge planning Services  CM Consult  Post Acute Care Choice:    Choice offered to:     DME Arranged:    DME Agency:     HH Arranged:    HH Agency:     Status of Service:  Completed, signed off  If discussed at [employer] of Stay Meetings, dates discussed:    Additional Comments:  [name], RN 04/24/2016, 3:42 PM

## 2016-04-24 NOTE — Progress Notes (Signed)
Preliminary results by tech - Venous Duplex Lower Ext. Completed. Negative for deep and superficial vein thrombosis in both legs.  Bartolo Montanye, BS, RDMS, RVT  

## 2016-04-28 LAB — CULTURE, BLOOD (ROUTINE X 2)
CULTURE: NO GROWTH
Culture: NO GROWTH

## 2016-09-10 DEATH — deceased
